# Patient Record
Sex: Male | Born: 1995 | Race: Black or African American | Hispanic: No | Marital: Single | Smoking: Former smoker
Health system: Southern US, Community
[De-identification: ages and names within clinical notes are randomized; demographics above are authoritative.]

## PROBLEM LIST (undated history)

## (undated) ENCOUNTER — Ambulatory Visit: Admission: EM | Payer: Medicaid Other

---

## 2019-05-29 ENCOUNTER — Other Ambulatory Visit: Payer: Self-pay

## 2019-05-29 ENCOUNTER — Encounter (HOSPITAL_COMMUNITY): Payer: Self-pay

## 2019-05-29 ENCOUNTER — Emergency Department (HOSPITAL_COMMUNITY)
Admission: EM | Admit: 2019-05-29 | Discharge: 2019-05-29 | Disposition: A | Discharge status: Home / Self Care | Payer: Worker's Compensation | Attending: Emergency Medicine | Admitting: Emergency Medicine

## 2019-05-29 ENCOUNTER — Emergency Department (HOSPITAL_COMMUNITY): Payer: Worker's Compensation

## 2019-05-29 DIAGNOSIS — Y9289 Other specified places as the place of occurrence of the external cause: Secondary | ICD-10-CM | POA: Insufficient documentation

## 2019-05-29 DIAGNOSIS — S8001XA Contusion of right knee, initial encounter: Secondary | ICD-10-CM | POA: Diagnosis not present

## 2019-05-29 DIAGNOSIS — Y990 Civilian activity done for income or pay: Secondary | ICD-10-CM | POA: Insufficient documentation

## 2019-05-29 DIAGNOSIS — Y9389 Activity, other specified: Secondary | ICD-10-CM | POA: Diagnosis not present

## 2019-05-29 DIAGNOSIS — S8991XA Unspecified injury of right lower leg, initial encounter: Secondary | ICD-10-CM | POA: Diagnosis present

## 2019-05-29 DIAGNOSIS — W230XXA Caught, crushed, jammed, or pinched between moving objects, initial encounter: Secondary | ICD-10-CM | POA: Insufficient documentation

## 2019-05-29 NOTE — ED Triage Notes (Signed)
Per ems: Pt coming from work when a loaded forklift pinned him against a conveyer belt. Pt is c/o right knee pain. Pt is ambulatory in triage.

## 2019-05-29 NOTE — ED Provider Notes (Signed)
[facility] DEPT Provider Note   CSN: [id number] Arrival date & time: 05/29/19  0144     History   Chief Complaint Chief Complaint  Patient presents with  . Knee Pain    HPI Jose Ochoa is a 23 y.o. male presents to the ER for evaluation of work-related injury that occurred last evening prior to arrival.  Patient was loading a conveyor belt with boxes when a forklift drove into him hitting his posterior right knee and bending his knee against a conveyor belt for approximately 20 to 30 seconds.  Reports left distal thigh and medial knee pain associated with swelling.  Pain is worse with direct palpation, minimal pain with weightbearing.  No interventions.  He has no pain at rest.  No previous history of injuries or surgeries to the knee.  No other physical injuries.  Denies distal tingling, loss of sensation, calf pain or swelling.  No anticoagulants.     HPI  History reviewed. No pertinent past medical history.  There are no active problems to display for this patient.   History reviewed. No pertinent surgical history.      Home Medications    Prior to Admission medications   Not on File    Family History No family history on file.  Social History Social History   Tobacco Use  . Smoking status: Not on file  Substance Use Topics  . Alcohol use: Not on file  . Drug use: Not on file     Allergies   Patient has no allergy information on record.   Review of Systems Review of Systems  Musculoskeletal: Positive for arthralgias and joint swelling.  All other systems reviewed and are negative.    Physical Exam Updated Vital Signs BP 110/65 (BP Location: Right Arm)   Pulse 60   Temp 98.2 F (36.8 C) (Oral)   Resp 16   SpO2 100% Comment: Simultaneous filing. User may not have seen previous data.  Physical Exam Constitutional:      Appearance: He is well-developed.  HENT:     Head: Normocephalic.     Nose: Nose normal.   Eyes:     General: Lids are normal.  Neck:     Musculoskeletal: Normal range of motion.  Cardiovascular:     Rate and Rhythm: Normal rate.     Comments: 1+ popliteal pulses bilaterally. Pulmonary:     Effort: Pulmonary effort is normal. No respiratory distress.  Musculoskeletal: Normal range of motion.        General: Swelling and tenderness present.     Comments: Mild, focal edema, contusion with overlying abrasion in the left distal femur/medial upper knee joint.  Mild medial joint line tenderness.  No focal bony tenderness to quad, patella tendons, patella, lateral joint line, popliteal fossa.  Patient stands and ambulates without any antalgic gait and only mild pain.  Full flexion and extension without pain, normal J tracking of the patella.  No joint laxity.  No proximal or distal calf tenderness or edema.  Neurological:     Mental Status: He is alert.     Comments: sensation strength intact in lower extremities bilaterally.  Psychiatric:        Behavior: Behavior normal.      ED Treatments / Results  Labs (all labs ordered are listed, but only abnormal results are displayed) Labs Reviewed - No data to display  EKG None  Radiology Dg Knee Complete 4 Views Right  Result Date: 05/29/2019 CLINICAL DATA:  Pain after trauma EXAM: RIGHT KNEE - COMPLETE 4+ VIEW COMPARISON:  None. FINDINGS: There is a defect in the upper outer quadrant of the patella is well corticated and is consistent with a bipartite patella. No other evidence of fracture. There may be a small joint effusion. IMPRESSION: Bipartite patella. Possible small effusion. No acute fracture noted. Electronically Signed   By: Gerome Sam III M.D   On: 05/29/2019 02:31    Procedures Procedures (including critical care time)  Medications Ordered in ED Medications - No data to display   Initial Impression / Assessment and Plan / ED Course  I have reviewed the triage vital signs and the nursing notes.  Pertinent  labs & imaging results that were available during my care of the patient were reviewed by me and considered in my medical decision making (see chart for details).  Clinical Course as of May 28 899  Sat May 29, 2019  5638 There is a defect in the upper outer quadrant of the patella is well corticated and is consistent with a bipartite patella. No other evidence of fracture. There may be a small joint effusion.  DG Knee Complete 4 Views Right [CG]    Clinical Course User Index [CG] Liberty Handy, PA-C   Exam is very benign, he has contusion in the left medial upper knee but overall reassuring.  No focal bony tenderness otherwise to knee.  Upper and lower compartments soft.  Neurovascularly intact.  X-ray obtained in triage shows bipartite patella with small joint effusion, otherwise nonacute.  Patient is ambulatory here with minimal pain.  Do not think further emergent lab work or imaging is indicated.  Discussed plan to discharge with knee sleeve, compression, elevation, ice, NSAIDs, early range of motion exercises.  Recommend orthopedist follow-up in 7 to 10 days if symptoms not improving.  Return precautions given.  Is comfortable with this.  Final Clinical Impressions(s) / ED Diagnoses   Final diagnoses:  Work related injury  Contusion of right knee, initial encounter    ED Discharge Orders    None       Liberty Handy, PA-C 05/29/19 0900    Alvira Monday, MD 05/31/19 1314

## 2019-05-29 NOTE — Discharge Instructions (Signed)
You were seen in the ER for work-related injury and knee pain.  X-ray showed a small amount of fluid in the joint that can happen from local trauma or falls.  X-ray did not show any acute bone related injuries.  It is possible you may have sustained a mild soft tissue injury.  We will treat this with a knee sleeve for compression, rest, ice, elevation and anti-inflammatories.  X-ray showed a bipartite knee, this is not an injury but can be seen as a normal variant in some people and usually benign and does not cause any issues.  Rest for the next 48 hours.  Elevate your knee.  Ice for 20 minutes at least 3 times a day.  Alternate ibuprofen and acetaminophen every 6-8 hours for pain.  Slowly return back to activities in the next 72 hours if symptoms are improving.

## 2019-11-18 ENCOUNTER — Emergency Department (HOSPITAL_COMMUNITY): Payer: Medicaid Other

## 2019-11-18 ENCOUNTER — Other Ambulatory Visit: Payer: Self-pay

## 2019-11-18 ENCOUNTER — Encounter (HOSPITAL_COMMUNITY): Payer: Self-pay

## 2019-11-18 DIAGNOSIS — Y9289 Other specified places as the place of occurrence of the external cause: Secondary | ICD-10-CM | POA: Insufficient documentation

## 2019-11-18 DIAGNOSIS — R2242 Localized swelling, mass and lump, left lower limb: Secondary | ICD-10-CM | POA: Insufficient documentation

## 2019-11-18 DIAGNOSIS — Y939 Activity, unspecified: Secondary | ICD-10-CM | POA: Insufficient documentation

## 2019-11-18 DIAGNOSIS — Z5321 Procedure and treatment not carried out due to patient leaving prior to being seen by health care provider: Secondary | ICD-10-CM | POA: Insufficient documentation

## 2019-11-18 DIAGNOSIS — Y990 Civilian activity done for income or pay: Secondary | ICD-10-CM | POA: Insufficient documentation

## 2019-11-18 DIAGNOSIS — M79662 Pain in left lower leg: Secondary | ICD-10-CM | POA: Insufficient documentation

## 2019-11-18 DIAGNOSIS — W230XXA Caught, crushed, jammed, or pinched between moving objects, initial encounter: Secondary | ICD-10-CM | POA: Insufficient documentation

## 2019-11-18 NOTE — ED Triage Notes (Signed)
Pt reports L lower leg pain and swelling. He states that he was working and his knee got pinned between a pallet and a pole at work. Swelling noted.

## 2019-11-19 ENCOUNTER — Encounter (HOSPITAL_COMMUNITY): Payer: Self-pay | Admitting: Emergency Medicine

## 2019-11-19 ENCOUNTER — Inpatient Hospital Stay: Payer: Medicaid Other | Admitting: Orthopedic Surgery

## 2019-11-19 ENCOUNTER — Other Ambulatory Visit: Payer: Self-pay

## 2019-11-19 ENCOUNTER — Emergency Department (HOSPITAL_COMMUNITY)
Admission: EM | Admit: 2019-11-19 | Discharge: 2019-11-19 | Disposition: A | Discharge status: Home / Self Care | Payer: Self-pay | Attending: Emergency Medicine | Admitting: Emergency Medicine

## 2019-11-19 ENCOUNTER — Emergency Department (HOSPITAL_BASED_OUTPATIENT_CLINIC_OR_DEPARTMENT_OTHER): Payer: Medicaid Other

## 2019-11-19 ENCOUNTER — Emergency Department (HOSPITAL_BASED_OUTPATIENT_CLINIC_OR_DEPARTMENT_OTHER)
Admission: EM | Admit: 2019-11-19 | Discharge: 2019-11-19 | Disposition: A | Discharge status: Home / Self Care | Payer: Self-pay | Attending: Emergency Medicine | Admitting: Emergency Medicine

## 2019-11-19 ENCOUNTER — Encounter (HOSPITAL_BASED_OUTPATIENT_CLINIC_OR_DEPARTMENT_OTHER): Payer: Self-pay

## 2019-11-19 DIAGNOSIS — S82402A Unspecified fracture of shaft of left fibula, initial encounter for closed fracture: Secondary | ICD-10-CM

## 2019-11-19 DIAGNOSIS — Y990 Civilian activity done for income or pay: Secondary | ICD-10-CM | POA: Insufficient documentation

## 2019-11-19 DIAGNOSIS — Y939 Activity, unspecified: Secondary | ICD-10-CM | POA: Insufficient documentation

## 2019-11-19 DIAGNOSIS — Y929 Unspecified place or not applicable: Secondary | ICD-10-CM | POA: Insufficient documentation

## 2019-11-19 DIAGNOSIS — M79662 Pain in left lower leg: Secondary | ICD-10-CM | POA: Insufficient documentation

## 2019-11-19 DIAGNOSIS — S42492A Other displaced fracture of lower end of left humerus, initial encounter for closed fracture: Secondary | ICD-10-CM | POA: Insufficient documentation

## 2019-11-19 DIAGNOSIS — W3189XA Contact with other specified machinery, initial encounter: Secondary | ICD-10-CM | POA: Insufficient documentation

## 2019-11-19 DIAGNOSIS — Z5321 Procedure and treatment not carried out due to patient leaving prior to being seen by health care provider: Secondary | ICD-10-CM | POA: Insufficient documentation

## 2019-11-19 DIAGNOSIS — Z87891 Personal history of nicotine dependence: Secondary | ICD-10-CM | POA: Insufficient documentation

## 2019-11-19 MED ORDER — IBUPROFEN 400 MG PO TABS
400.0000 mg | ORAL_TABLET | Freq: Once | ORAL | Status: AC | PRN
  Administered 2019-11-19: 400 mg via ORAL
  Filled 2019-11-19: qty 1

## 2019-11-19 MED ORDER — HYDROCODONE-ACETAMINOPHEN 5-325 MG PO TABS: 1.0000 | ORAL_TABLET | ORAL | 0 refills | Status: DC | PRN

## 2019-11-19 MED ORDER — HYDROCODONE-ACETAMINOPHEN 5-325 MG PO TABS
1.0000 | ORAL_TABLET | Freq: Once | ORAL | Status: AC
  Administered 2019-11-19: 1 via ORAL
  Filled 2019-11-19: qty 1

## 2019-11-19 NOTE — ED Provider Notes (Signed)
[facility] DEPART[facility] Provider Note   CSN: [id number] Arrival date & time: 11/19/19  0018   History Chief Complaint  Patient presents with  . Leg Injury    [facility] Ochoa is a 24 y.o. male.  The history is provided by the patient.  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  He is complaining of pain which he rates at 8/10.  He denies other injury.  History reviewed. No pertinent past medical history.  There are no problems to display for this patient.   History reviewed. No pertinent surgical history.     No family history on file.  Social History   Tobacco Use  . Smoking status: Former Research scientist (life sciences)  . Smokeless tobacco: Never Used  Substance Use Topics  . Alcohol use: Yes  . Drug use: Not Currently    Home Medications Prior to Admission medications   Not on File    Allergies    Patient has no known allergies.  Review of Systems   Review of Systems  All other systems reviewed and are negative.   Physical Exam Updated Vital Signs BP 132/83 (BP Location: Right Arm)   Pulse 74   Temp 98.8 F (37.1 C) (Oral)   Resp 20   Ht 5\' 8"  (1.727 m)   Wt 70.3 kg   SpO2 100%   BMI 23.57 kg/m   Physical Exam Vitals and nursing note reviewed.   24 year old male, resting comfortably and in no acute distress. Vital signs are normal. Oxygen saturation is 100%, which is normal. Head is normocephalic and atraumatic. PERRLA, EOMI. Oropharynx is clear. Neck is nontender and supple without adenopathy or JVD. Back is nontender and there is no CVA tenderness. Lungs are clear without rales, wheezes, or rhonchi. Chest is nontender. Heart has regular rate and rhythm without murmur. Abdomen is soft, flat, nontender without masses or hepatosplenomegaly and peristalsis is normoactive. Extremities: There is moderate swelling of the left ankle extending into the distal lower leg and proximal midfoot.  There is tenderness to  palpation in the same area.  There is mild instability of the ankle mortise.  Dorsalis pedis pulse is palpable and there is normal sensation.  Capillary refill is prompt.  No other extremity injury seen. Skin is warm and dry without rash. Neurologic: Mental status is normal, cranial nerves are intact, there are no motor or sensory deficits.  ED Results / Procedures / Treatments    Radiology DG Tibia/Fibula Left  Result Date: 11/18/2019 CLINICAL DATA:  Left lower leg pain and swelling. Leg got pinned between a pallet and pole at work. Pain distally. EXAM: LEFT TIBIA AND FIBULA - 2 VIEW COMPARISON:  None. FINDINGS: Transverse fracture of the distal fibula just above the ankle mortise. Cortical irregularity of the distal tibia just above the medial malleolus suspicious for acute fracture. No definite posterior tibial tubercle fracture. Proximal tibia and fibula are intact. Generalized soft tissue edema which is most prominent distally. There is an ankle joint effusion. IMPRESSION: 1. Nondisplaced fracture of the distal fibula just above the ankle mortise. 2. Cortical irregularity of the distal tibia just above the medial malleolus suspicious for nondisplaced fracture. 3. Soft tissue edema about the distal lower leg at the fracture sites. Electronically Signed   By: Keith Rake M.D.   On: 11/18/2019 21:31    Procedures .Splint Application  Date/Time: 11/19/2019 4:46 AM Performed by: Delora Fuel, MD Authorized by: Delora Fuel,  MD   Consent:    Consent obtained:  Verbal   Consent given by:  Patient   Risks discussed:  Pain, numbness and swelling   Alternatives discussed:  No treatment Pre-procedure details:    Sensation:  Normal   Skin color:  Normal Procedure details:    Laterality:  Left   Location:  Ankle   Ankle:  L ankle   Splint type: CAM Walker.   Supplies used: CAM Walker. Post-procedure details:    Pain:  Unchanged   Sensation:  Normal   Skin color:  Normal   Patient  tolerance of procedure:  Tolerated well, no immediate complications    Medications Ordered in ED Medications  ibuprofen (ADVIL) tablet 400 mg (400 mg Oral Given 11/19/19 0205)  HYDROcodone-acetaminophen (NORCO/VICODIN) 5-325 MG per tablet 1 tablet (1 tablet Oral Given 11/19/19 0302)    ED Course  I have reviewed the triage vital signs and the nursing notes.  Pertinent labs & imaging results that were available during my care of the patient were reviewed by me and considered in my medical decision making (see chart for details).  MDM Rules/Calculators/A&P Left ankle injury.  He is being sent for x-rays.  He is given a dose of hydrocodone-acetaminophen for pain.  Old records are reviewed, and he does have a prior ED visit for a work-related injury.  X-rays confirm nondisplaced fracture of the medial and lateral malleolus.  He is placed in a cam walker and given crutches and is referred to orthopedics for follow-up.  Told to use ibuprofen for pain control but given a prescription for a small number of hydrocodone-acetaminophen tablets.  Final Clinical Impression(s) / ED Diagnoses Final diagnoses:  Traumatic closed displaced fracture of distal end of left tibia with fibula, initial encounter    Rx / DC Orders ED Discharge Orders         Ordered    HYDROcodone-acetaminophen (NORCO) 5-325 MG tablet  Every 4 hours PRN     11/19/19 0432           Dione Booze, MD 11/19/19 0448

## 2019-11-19 NOTE — ED Triage Notes (Signed)
Pt states his LLE was pinned between a pallet jack and the wall. Pt has obvious swelling to the lower extremity.

## 2019-11-19 NOTE — Discharge Instructions (Addendum)
Wear the CAM Walker at all times, except when bathing.  No weight bearing until cleared by the orthopedic doctor.  Apply ice for thirty minutes at a time, four times a day.  Take ibuprofen as needed for pain. Reserve hydrocodone-acetaminophen for pain not relieved by ibuprofen.

## 2019-11-19 NOTE — ED Triage Notes (Signed)
Pt from home arrived via EMS for left leg pain post tib/fib fx x1 day ago. Seen at [facility] last evening given vicodin for pain that is ineffective

## 2019-11-19 NOTE — ED Notes (Signed)
Pt at xray

## 2019-11-19 NOTE — ED Notes (Signed)
Pt refused to sign discharge until he spoke with registration about payment

## 2019-11-20 ENCOUNTER — Emergency Department (HOSPITAL_COMMUNITY)
Admission: EM | Admit: 2019-11-20 | Discharge: 2019-11-20 | Disposition: A | Discharge status: Home / Self Care | Payer: Medicaid Other | Source: Home / Self Care

## 2019-11-23 ENCOUNTER — Encounter: Payer: Self-pay | Admitting: Orthopaedic Surgery

## 2019-11-23 ENCOUNTER — Ambulatory Visit (INDEPENDENT_AMBULATORY_CARE_PROVIDER_SITE_OTHER): Payer: No Typology Code available for payment source | Admitting: Orthopaedic Surgery

## 2019-11-23 ENCOUNTER — Other Ambulatory Visit: Payer: Self-pay

## 2019-11-23 DIAGNOSIS — S82845A Nondisplaced bimalleolar fracture of left lower leg, initial encounter for closed fracture: Secondary | ICD-10-CM

## 2019-11-23 NOTE — Progress Notes (Signed)
Office Visit Note   Patient: [facility] O[facility]a           Date of Birth: 1995-10-05           MRN: [id number] Visit Date: 11/23/2019              Requested by: No referring provider defined for this encounter. PCP: Patient, No Pcp Per   Assessment & Plan: Visit Diagnoses:  1. Closed nondisplaced bimalleolar fracture of left ankle, initial encounter     Plan: Impression is nondisplaced bimalleolar ankle fractures.  We will monitor this fracture closely as we will try to treat this nonoperatively.  He needs to be nonweightbearing.  He needs to be out of work for at least 6weeks.  He will ambulate with crutches while he is nonweightbearing.  Recheck next week with three-view x-rays of the left ankle.  All of this was discussed with his mother in detail.  Follow-Up Instructions: Return in about 1 week (around 11/30/2019).   Orders:  No orders of the defined types were placed in this encounter.  No orders of the defined types were placed in this encounter.     Procedures: No procedures performed   Clinical Data: No additional findings.   Subjective: Chief Complaint  Patient presents with  . Left Ankle - Pain    [facility] Ochoa is a 24 year old comes in for evaluation of a work injury that he sustained on 11/18/2019.  His leg was pinned between some pallets.  He works for [employer]  He was evaluated in the ER and x-rays showed nondisplaced bimalleolar ankle fracture.  He was placed in a splint and given follow-up.  He endorses a lot of throbbing pulsating pain in his left leg.  Endorses some numbness and tingling as well.   Review of Systems  Constitutional: Negative.   All other systems reviewed and are negative.    Objective: Vital Signs: There were no vitals taken for this visit.  Physical Exam Vitals and nursing note reviewed.  Constitutional:      Appearance: He is well-developed.  HENT:     Head: Normocephalic and atraumatic.  Eyes:     Pupils:  Pupils are equal, round, and reactive to light.  Pulmonary:     Effort: Pulmonary effort is normal.  Abdominal:     Palpations: Abdomen is soft.  Musculoskeletal:        General: Normal range of motion.     Cervical back: Neck supple.  Skin:    General: Skin is warm.  Neurological:     Mental Status: He is alert and oriented to person, place, and time.  Psychiatric:        Behavior: Behavior normal.        Thought Content: Thought content normal.        Judgment: Judgment normal.     Ortho Exam Left leg and ankle show moderate swelling without any neurovascular compromise. Specialty Comments:  No specialty comments available.  Imaging: No results found.   PMFS History: Patient Active Problem List   Diagnosis Date Noted  . Closed nondisplaced bimalleolar fracture of left lower leg 11/23/2019   History reviewed. No pertinent past medical history.  History reviewed. No pertinent family history.  History reviewed. No pertinent surgical history. Social History   Occupational History  . Not on file  Tobacco Use  . Smoking status: Former Games developer  . Smokeless tobacco: Never Used  Substance and Sexual Activity  . Alcohol use: Yes  .  Drug use: Not Currently  . Sexual activity: Not on file

## 2019-11-25 ENCOUNTER — Telehealth: Payer: Self-pay | Admitting: Orthopaedic Surgery

## 2019-11-25 ENCOUNTER — Ambulatory Visit (INDEPENDENT_AMBULATORY_CARE_PROVIDER_SITE_OTHER): Payer: Worker's Compensation

## 2019-11-25 DIAGNOSIS — S82845A Nondisplaced bimalleolar fracture of left lower leg, initial encounter for closed fracture: Secondary | ICD-10-CM

## 2019-11-25 NOTE — Progress Notes (Signed)
Patient comes in today for nurse visit. He got the splint wet. I removed and reapplied a new splint today.

## 2019-11-25 NOTE — Telephone Encounter (Signed)
Raymon Mutton from [facility] called.   They need the office notes from the patient's last appointment faxed over   Fax number: [phone] Call back: [phone]

## 2019-11-25 NOTE — Telephone Encounter (Signed)
FAXED

## 2019-11-28 ENCOUNTER — Encounter (HOSPITAL_COMMUNITY): Payer: Self-pay | Admitting: Emergency Medicine

## 2019-11-28 ENCOUNTER — Other Ambulatory Visit: Payer: Self-pay

## 2019-11-28 ENCOUNTER — Emergency Department (HOSPITAL_COMMUNITY)
Admission: EM | Admit: 2019-11-28 | Discharge: 2019-11-29 | Disposition: A | Discharge status: Home / Self Care | Payer: Self-pay | Attending: Emergency Medicine | Admitting: Emergency Medicine

## 2019-11-28 DIAGNOSIS — S82842D Displaced bimalleolar fracture of left lower leg, subsequent encounter for closed fracture with routine healing: Secondary | ICD-10-CM | POA: Insufficient documentation

## 2019-11-28 DIAGNOSIS — Y990 Civilian activity done for income or pay: Secondary | ICD-10-CM | POA: Insufficient documentation

## 2019-11-28 DIAGNOSIS — Y9289 Other specified places as the place of occurrence of the external cause: Secondary | ICD-10-CM | POA: Insufficient documentation

## 2019-11-28 DIAGNOSIS — X58XXXD Exposure to other specified factors, subsequent encounter: Secondary | ICD-10-CM | POA: Insufficient documentation

## 2019-11-28 DIAGNOSIS — Y939 Activity, unspecified: Secondary | ICD-10-CM | POA: Insufficient documentation

## 2019-11-28 DIAGNOSIS — Z87891 Personal history of nicotine dependence: Secondary | ICD-10-CM | POA: Insufficient documentation

## 2019-11-28 NOTE — ED Triage Notes (Addendum)
Pt BIB EMS from home c/o left leg pain. Pt has hx of broken left tib/fib that occurred on 5/28. Pt reporting his current pain meds aren't working for him. No other complaints.

## 2019-11-29 NOTE — ED Provider Notes (Signed)
[facility] DEPT Provider Note  CSN: [id number] Arrival date & time: 11/28/19 2244  Chief Complaint(s) Leg Pain  HPI [facility] Ochoa is a 24 y.o. male with recent left bimalleolar fracture s/p splinting and ortho follow up here for recurring severe pain of the left ankle. No additional trauma. States that Norco does not help, but motrin and tylenol provide some relief. Awoke from a nap today with severe pain and wanted a second opinion regarding surgery.  HPI  Past Medical History History reviewed. No pertinent past medical history. Patient Active Problem List   Diagnosis Date Noted   Closed nondisplaced bimalleolar fracture of left lower leg 11/23/2019   Home Medication(s) Prior to Admission medications   Medication Sig Start Date End Date Taking? Authorizing Provider  HYDROcodone-acetaminophen (NORCO) 5-325 MG tablet Take 1 tablet by mouth every 4 (four) hours as needed for moderate pain. 10/08/58   Delora Fuel, MD                                                                                                                                    Past Surgical History History reviewed. No pertinent surgical history. Family History No family history on file.  Social History Social History   Tobacco Use   Smoking status: Former Smoker   Smokeless tobacco: Never Used  Substance Use Topics   Alcohol use: Yes   Drug use: Not Currently   Allergies Patient has no known allergies.  Review of Systems Review of Systems All other systems are reviewed and are negative for acute change except as noted in the HPI Physical Exam Vital Signs  I have reviewed the triage vital signs BP 138/67 (BP Location: Right Arm)    Pulse (!) 106    Temp 98.6 F (37 C) (Oral)    Resp 16    SpO2 99%   Physical Exam Vitals reviewed.  Constitutional:      General: He is not in acute distress.    Appearance: He is well-developed. He is not diaphoretic.  HENT:   Head: Normocephalic and atraumatic.     Jaw: No trismus.     Right Ear: External ear normal.     Left Ear: External ear normal.     Nose: Nose normal.  Eyes:     General: No scleral icterus.    Conjunctiva/sclera: Conjunctivae normal.  Neck:     Trachea: Phonation normal.  Cardiovascular:     Rate and Rhythm: Normal rate and regular rhythm.  Pulmonary:     Effort: Pulmonary effort is normal. No respiratory distress.     Breath sounds: No stridor.  Abdominal:     General: There is no distension.  Musculoskeletal:     Cervical back: Normal range of motion.     Left ankle: Swelling present. Tenderness present over the lateral malleolus and medial malleolus. Decreased range of motion (to ankle, still able to move toes).  Normal pulse.       Legs:     Comments: Sensation intact. Compartments soft. No calf tenderness   Neurological:     Mental Status: He is alert and oriented to person, place, and time.  Psychiatric:        Behavior: Behavior normal.     ED Results and Treatments Labs (all labs ordered are listed, but only abnormal results are displayed) Labs Reviewed - No data to display                                                                                                                       EKG  EKG Interpretation  Date/Time:    Ventricular Rate:    PR Interval:    QRS Duration:   QT Interval:    QTC Calculation:   R Axis:     Text Interpretation:        Radiology No results found.  Pertinent labs & imaging results that were available during my care of the patient were reviewed by me and considered in my medical decision making (see chart for details).  Medications Ordered in ED Medications - No data to display                                                                                                                                  Procedures .Splint Application  Date/Time: 11/29/2019 1:44 AM Performed by: Nira Conn, MD Authorized  by: Nira Conn, MD   Consent:    Consent obtained:  Verbal   Consent given by:  Patient Pre-procedure details:    Sensation:  Normal Procedure details:    Laterality:  Left   Location:  Ankle   Ankle:  L ankle   Splint type:  Short leg   Supplies:  Cotton padding Post-procedure details:    Patient tolerance of procedure:  Tolerated well, no immediate complications    (including critical care time)  Medical Decision Making / ED Course I have reviewed the nursing notes for this encounter and the patient's prior records (if available in EHR or on provided paperwork).   [name] was evaluated in Emergency Department on 11/29/2019 for the symptoms described in the history of present illness. He was evaluated in the context of the global COVID-19 pandemic, which necessitated consideration that the patient might be at risk for infection with the SARS-CoV-2 virus that causes COVID-19. Institutional protocols  and algorithms that pertain to the evaluation of patients at risk for COVID-19 are in a state of rapid change based on information released by regulatory bodies including the CDC and federal and state organizations. These policies and algorithms were followed during the patient's care in the ED.  Pain related to recent fracture. No evidence compartment syndrome. No additional trauma - no imaging needed. Doubt DVT.  Has f/u with Ortho again this Friday.      Final Clinical Impression(s) / ED Diagnoses Final diagnoses:  Closed bimalleolar fracture of left ankle with routine healing, subsequent encounter   The patient appears reasonably screened and/or stabilized for discharge and I doubt any other medical condition or other [facility] requiring further screening, evaluation, or treatment in the ED at this time prior to discharge. Safe for discharge with strict return precautions.  Disposition: Discharge  Condition: Good  I have discussed the results, Dx and Tx plan with  the patient/family who expressed understanding and agree(s) with the plan. Discharge instructions discussed at length. The patient/family was given strict return precautions who verbalized understanding of the instructions. No further questions at time of discharge.    ED Discharge Orders    None        This chart was dictated using voice recognition software.  Despite best efforts to proofread,  errors can occur which can change the documentation meaning.   Nira Conn, MD 11/29/19 [id number]

## 2019-11-30 ENCOUNTER — Encounter: Payer: Self-pay | Admitting: Orthopaedic Surgery

## 2019-11-30 ENCOUNTER — Ambulatory Visit (INDEPENDENT_AMBULATORY_CARE_PROVIDER_SITE_OTHER): Payer: Self-pay

## 2019-11-30 ENCOUNTER — Other Ambulatory Visit: Payer: Self-pay

## 2019-11-30 ENCOUNTER — Ambulatory Visit (INDEPENDENT_AMBULATORY_CARE_PROVIDER_SITE_OTHER): Payer: No Typology Code available for payment source | Admitting: Orthopaedic Surgery

## 2019-11-30 DIAGNOSIS — S82845A Nondisplaced bimalleolar fracture of left lower leg, initial encounter for closed fracture: Secondary | ICD-10-CM

## 2019-11-30 MED ORDER — GABAPENTIN 100 MG PO CAPS: 100.0000 mg | ORAL_CAPSULE | Freq: Three times a day (TID) | ORAL | 3 refills | Status: AC

## 2019-11-30 MED ORDER — ZOLPIDEM TARTRATE 5 MG PO TABS: 5.0000 mg | ORAL_TABLET | Freq: Every evening | ORAL | 0 refills | Status: DC | PRN

## 2019-11-30 MED ORDER — KETOROLAC TROMETHAMINE 10 MG PO TABS: 10.0000 mg | ORAL_TABLET | Freq: Two times a day (BID) | ORAL | 0 refills | Status: AC | PRN

## 2019-11-30 NOTE — Progress Notes (Signed)
Office Visit Note   Patient: Jose Ochoa           Date of Birth: 02-03-1996           MRN: [id number] Visit Date: 11/30/2019              Requested by: No referring provider defined for this encounter. PCP: Patient, No Pcp Per   Assessment & Plan: Visit Diagnoses:  1. Closed nondisplaced bimalleolar fracture of left ankle, initial encounter     Plan: Impression is stable bimalleolar ankle fracture.  We had a lengthy discussion today and I showed him the x-rays show that these fractures are nondisplaced and that surgery would not help with his swelling and pain and in fact would likely make his symptoms worse.  I have sent in a prescription for gabapentin and Ambien and Toradol to help with his symptoms.  He needs to remain out of work.  Recheck in 2 weeks with three-view x-rays of the left ankle.  We provide him with a cam boot so that he can place ice packs on his ankle.  He needs to remain strictly nonweightbearing.  Follow-Up Instructions: Return in about 2 weeks (around 12/14/2019).   Orders:  Orders Placed This Encounter  Procedures   XR Ankle Complete Left   Meds ordered this encounter  Medications   gabapentin (NEURONTIN) 100 MG capsule    Sig: Take 1 capsule (100 mg total) by mouth 3 (three) times daily.    Dispense:  90 capsule    Refill:  3   zolpidem (AMBIEN) 5 MG tablet    Sig: Take 1-2 tablets (5-10 mg total) by mouth at bedtime as needed for sleep.    Dispense:  30 tablet    Refill:  0   ketorolac (TORADOL) 10 MG tablet    Sig: Take 1 tablet (10 mg total) by mouth 2 (two) times daily as needed.    Dispense:  10 tablet    Refill:  0      Procedures: No procedures performed   Clinical Data: No additional findings.   Subjective: Chief Complaint  Patient presents with   Left Ankle - Pain, Follow-up    Jose Ochoa returns today for evaluation of continued pain.  He states that he is not finding any relief with Tylenol Advil and hydrocodone.  He will  also like to have something to help him sleep.  He felt like the splint was too tight.  He is very concerned that is not healing correctly or needs surgery.   Review of Systems   Objective: Vital Signs: There were no vitals taken for this visit.  Physical Exam  Ortho Exam Exam shows moderately severe swelling.  No neurovascular compromise.  Limited range of motion. Specialty Comments:  No specialty comments available.  Imaging: XR Ankle Complete Left  Result Date: 11/30/2019 Stable nondisplaced bimalleolar ankle fracture without any interval worsening or displacement    PMFS History: Patient Active Problem List   Diagnosis Date Noted   Closed nondisplaced bimalleolar fracture of left lower leg 11/23/2019   History reviewed. No pertinent past medical history.  History reviewed. No pertinent family history.  History reviewed. No pertinent surgical history. Social History   Occupational History   Not on file  Tobacco Use   Smoking status: Former Smoker   Smokeless tobacco: Never Used  Substance and Sexual Activity   Alcohol use: Yes   Drug use: Not Currently   Sexual activity: Not on file

## 2019-12-03 ENCOUNTER — Ambulatory Visit: Payer: Medicaid Other | Admitting: Orthopaedic Surgery

## 2019-12-14 ENCOUNTER — Ambulatory Visit (INDEPENDENT_AMBULATORY_CARE_PROVIDER_SITE_OTHER): Payer: Self-pay

## 2019-12-14 ENCOUNTER — Ambulatory Visit (INDEPENDENT_AMBULATORY_CARE_PROVIDER_SITE_OTHER): Payer: Self-pay | Admitting: Orthopaedic Surgery

## 2019-12-14 DIAGNOSIS — S82845A Nondisplaced bimalleolar fracture of left lower leg, initial encounter for closed fracture: Secondary | ICD-10-CM

## 2019-12-14 MED ORDER — ZOLPIDEM TARTRATE 5 MG PO TABS: 5.0000 mg | ORAL_TABLET | Freq: Every evening | ORAL | 0 refills | Status: AC | PRN

## 2019-12-14 MED ORDER — TRAZODONE HCL 50 MG PO TABS: 50.0000 mg | ORAL_TABLET | Freq: Every evening | ORAL | 3 refills | Status: AC | PRN

## 2019-12-14 MED ORDER — MELATONIN 3 MG PO TABS: 3.0000 mg | ORAL_TABLET | Freq: Every day | ORAL | 3 refills | Status: AC

## 2019-12-14 NOTE — Progress Notes (Signed)
Office Visit Note   Patient: Jose Ochoa           Date of Birth: Nov 13, 1995           MRN: [id number] Visit Date: 12/14/2019              Requested by: No referring provider defined for this encounter. PCP: Patient, No Pcp Per   Assessment & Plan: Visit Diagnoses:  1. Closed nondisplaced bimalleolar fracture of left ankle, initial encounter     Plan: We placed him in a short leg cast.  He is to take baby aspirin twice a day for DVT prophylaxis.  I refilled his Ambien but also given prescription for trazodone and melatonin to try first to help with sleeping.  Recheck in 3 weeks with three-view x-rays of the left ankle.  Follow-Up Instructions: Return in about 3 weeks (around 01/04/2020).   Orders:  Orders Placed This Encounter  Procedures  . XR Ankle Complete Left   Meds ordered this encounter  Medications  . traZODone (DESYREL) 50 MG tablet    Sig: Take 1 tablet (50 mg total) by mouth at bedtime as needed for sleep.    Dispense:  20 tablet    Refill:  3  . melatonin 3 MG TABS tablet    Sig: Take 1 tablet (3 mg total) by mouth at bedtime.    Dispense:  20 tablet    Refill:  3  . zolpidem (AMBIEN) 5 MG tablet    Sig: Take 1 tablet (5 mg total) by mouth at bedtime as needed for sleep.    Dispense:  20 tablet    Refill:  0      Procedures: No procedures performed   Clinical Data: No additional findings.   Subjective: Chief Complaint  Patient presents with  . Left Ankle - Fracture, Follow-up    Buren returns today for his bimalleolar ankle fracture.  He is doing better.   Review of Systems  Constitutional: Negative.   All other systems reviewed and are negative.    Objective: Vital Signs: There were no vitals taken for this visit.  Physical Exam Vitals and nursing note reviewed.  Constitutional:      Appearance: He is well-developed.  Pulmonary:     Effort: Pulmonary effort is normal.  Abdominal:     Palpations: Abdomen is soft.  Skin:     General: Skin is warm.  Neurological:     Mental Status: He is alert and oriented to person, place, and time.  Psychiatric:        Behavior: Behavior normal.        Thought Content: Thought content normal.        Judgment: Judgment normal.     Ortho Exam He has less swelling and less pain overall. Specialty Comments:  No specialty comments available.  Imaging: XR Ankle Complete Left  Result Date: 12/14/2019 Stable appearance of bimalleolar ankle fracture without complications or worsening.    PMFS History: Patient Active Problem List   Diagnosis Date Noted  . Closed nondisplaced bimalleolar fracture of left lower leg 11/23/2019   No past medical history on file.  No family history on file.  No past surgical history on file. Social History   Occupational History  . Not on file  Tobacco Use  . Smoking status: Former Games developer  . Smokeless tobacco: Never Used  Vaping Use  . Vaping Use: Never used  Substance and Sexual Activity  . Alcohol use: Yes  .  Drug use: Not Currently  . Sexual activity: Not on file

## 2020-01-04 ENCOUNTER — Ambulatory Visit: Payer: Medicaid Other | Admitting: Orthopaedic Surgery

## 2020-01-04 ENCOUNTER — Telehealth: Payer: Self-pay | Admitting: Orthopaedic Surgery

## 2020-01-04 ENCOUNTER — Ambulatory Visit (INDEPENDENT_AMBULATORY_CARE_PROVIDER_SITE_OTHER): Payer: Self-pay | Admitting: Orthopaedic Surgery

## 2020-01-04 ENCOUNTER — Ambulatory Visit (INDEPENDENT_AMBULATORY_CARE_PROVIDER_SITE_OTHER): Payer: Self-pay

## 2020-01-04 ENCOUNTER — Encounter: Payer: Self-pay | Admitting: Orthopaedic Surgery

## 2020-01-04 VITALS — Ht 68.0 in | Wt 155.0 lb

## 2020-01-04 DIAGNOSIS — S82845A Nondisplaced bimalleolar fracture of left lower leg, initial encounter for closed fracture: Secondary | ICD-10-CM

## 2020-01-04 MED ORDER — HYDROCODONE-ACETAMINOPHEN 5-325 MG PO TABS: 1.0000 | ORAL_TABLET | Freq: Every day | ORAL | 0 refills | Status: AC | PRN

## 2020-01-04 NOTE — Progress Notes (Signed)
   Office Visit Note   Patient: Jose Ochoa           Date of Birth: 23-Feb-1996           MRN: [id number] Visit Date: 01/04/2020              Requested by: No referring provider defined for this encounter. PCP: Patient, No Pcp Per   Assessment & Plan: Visit Diagnoses:  1. Closed nondisplaced bimalleolar fracture of left ankle, initial encounter     Plan: Impression is 7 weeks status post left ankle nondisplaced bimalleolar ankle fracture.  This fracture is healing nicely at this point and we will transition him into a cam walker weightbearing as tolerated.  We will also start him in physical therapy.  Internal prescription was sent in.  I have refilled his Norco today upon his request.  He will follow-up with [place] in 3 weeks time for repeat evaluation three-view x-rays of the left ankle.  Follow-Up Instructions: Return in about 3 weeks (around 01/25/2020).   Orders:  Orders Placed This Encounter  Procedures  . XR Ankle Complete Left  . Ambulatory referral to Physical Therapy   Meds ordered this encounter  Medications  . HYDROcodone-acetaminophen (NORCO) 5-325 MG tablet    Sig: Take 1-2 tablets by mouth daily as needed.    Dispense:  10 tablet    Refill:  0      Procedures: No procedures performed   Clinical Data: No additional findings.   Subjective: Chief Complaint  Patient presents with  . Left Ankle - Pain    HPI patient is a pleasant 24 year old who comes in today 7 weeks out nondisplaced bimalleolar ankle fracture on the left, date of injury 11/18/2019.  He has been compliant nonweightbearing in a short leg cast.  He has had mild to moderate pain which is relieved with Norco.  Review of Systems as detailed in HPI.  All others reviewed and are negative.   Objective: Vital Signs: Ht 5\' 8"  (1.727 m)   Wt 155 lb (70.3 kg)   BMI 23.57 kg/m   Physical Exam well-developed well-nourished gentleman in no acute distress.  Alert and oriented x3.  Ortho Exam  examination of his left ankle reveals minimal swelling.  Moderate tenderness to the lateral aspect.  No tenderness medially.  Limited range of motion secondary to stiffness.  Calf is soft nontender.  He is neurovascular intact distally.  Specialty Comments:  No specialty comments available.  Imaging: XR Ankle Complete Left  Result Date: 01/04/2020 X-rays demonstrate stable alignment of the fractures with considerable bony consolidation    PMFS History: Patient Active Problem List   Diagnosis Date Noted  . Closed nondisplaced bimalleolar fracture of left lower leg 11/23/2019   History reviewed. No pertinent past medical history.  History reviewed. No pertinent family history.  History reviewed. No pertinent surgical history. Social History   Occupational History  . Not on file  Tobacco Use  . Smoking status: Former 01/23/2020  . Smokeless tobacco: Never Used  Vaping Use  . Vaping Use: Never used  Substance and Sexual Activity  . Alcohol use: Yes  . Drug use: Not Currently  . Sexual activity: Not on file

## 2020-01-04 NOTE — Telephone Encounter (Signed)
Patient stopped by before leaving.   He requested that today's appointment notes be faxed to [phone]

## 2020-01-05 NOTE — Telephone Encounter (Signed)
FAXED

## 2020-01-19 ENCOUNTER — Ambulatory Visit: Payer: Medicaid Other | Admitting: Physical Therapy

## 2020-01-19 ENCOUNTER — Telehealth: Payer: Self-pay

## 2020-01-19 ENCOUNTER — Other Ambulatory Visit: Payer: Self-pay

## 2020-01-19 NOTE — Telephone Encounter (Signed)
Patient called in needing documentation for PT . Says when he went for his appt they advised him he needed documentation. But wasn't sure for what.

## 2020-01-25 ENCOUNTER — Ambulatory Visit (INDEPENDENT_AMBULATORY_CARE_PROVIDER_SITE_OTHER): Payer: Self-pay | Admitting: Orthopaedic Surgery

## 2020-01-25 ENCOUNTER — Telehealth: Payer: Self-pay | Admitting: Orthopaedic Surgery

## 2020-01-25 ENCOUNTER — Ambulatory Visit (INDEPENDENT_AMBULATORY_CARE_PROVIDER_SITE_OTHER): Payer: Self-pay

## 2020-01-25 ENCOUNTER — Encounter: Payer: Self-pay | Admitting: Orthopaedic Surgery

## 2020-01-25 DIAGNOSIS — S82845A Nondisplaced bimalleolar fracture of left lower leg, initial encounter for closed fracture: Secondary | ICD-10-CM

## 2020-01-25 NOTE — Progress Notes (Signed)
°  °  Patient: Jose Ochoa           Date of Birth: August 27, 1995           MRN: [id number] Visit Date: 01/25/2020 PCP: Patient, No Pcp Per   Assessment & Plan:  Chief Complaint:  Chief Complaint  Patient presents with   Left Ankle - Follow-up   Visit Diagnoses:  1. Closed nondisplaced bimalleolar fracture of left ankle, initial encounter     Plan: Jose Ochoa is following up today for his nondisplaced bimalleolar ankle fracture.  He feels about 80% better.  He has been walking with and without the boot without significant discomfort.  His swelling has improved significantly.  He is improved range of motion.  No bony tenderness.  X-rays are also demonstrating continued fracture consolidation.  At this point we will transition to an ASO brace.  He will continue with home exercise.  Recheck in 6 weeks with three-view x-rays of the left ankle.  Follow-Up Instructions: Return in about 6 weeks (around 03/07/2020).   Orders:  Orders Placed This Encounter  Procedures   XR Ankle Complete Left   No orders of the defined types were placed in this encounter.   Imaging: XR Ankle Complete Left  Result Date: 01/25/2020 Continued fracture consolidation.   PMFS History: Patient Active Problem List   Diagnosis Date Noted   Closed nondisplaced bimalleolar fracture of left lower leg 11/23/2019   History reviewed. No pertinent past medical history.  History reviewed. No pertinent family history.  History reviewed. No pertinent surgical history. Social History   Occupational History   Not on file  Tobacco Use   Smoking status: Former Smoker   Smokeless tobacco: Never Used  Building services engineer Use: Never used  Substance and Sexual Activity   Alcohol use: Yes   Drug use: Not Currently   Sexual activity: Not on file

## 2020-01-25 NOTE — Telephone Encounter (Signed)
Patient stopped by before leaving  Wanted to know if he had any work restrictions   Call back: [phone]

## 2020-01-25 NOTE — Telephone Encounter (Signed)
Patient comes in today

## 2020-01-26 NOTE — Telephone Encounter (Signed)
No prolonged standing or walking.  No climbing.  No lifting more than 20 lbs.

## 2020-01-26 NOTE — Telephone Encounter (Signed)
NOTE MADE 

## 2020-01-26 NOTE — Telephone Encounter (Signed)
Called patient no answer LMOM to return our call. Just need to advise on message below.   Note ready for pick up.

## 2020-03-07 ENCOUNTER — Ambulatory Visit (INDEPENDENT_AMBULATORY_CARE_PROVIDER_SITE_OTHER): Payer: Self-pay

## 2020-03-07 ENCOUNTER — Encounter: Payer: Self-pay | Admitting: Orthopaedic Surgery

## 2020-03-07 ENCOUNTER — Ambulatory Visit (INDEPENDENT_AMBULATORY_CARE_PROVIDER_SITE_OTHER): Payer: Self-pay | Admitting: Orthopaedic Surgery

## 2020-03-07 DIAGNOSIS — S82845A Nondisplaced bimalleolar fracture of left lower leg, initial encounter for closed fracture: Secondary | ICD-10-CM

## 2020-03-07 NOTE — Progress Notes (Signed)
Office Visit Note   Patient: Jose Ochoa           Date of Birth: 10-May-1996           MRN: [id number] Visit Date: 03/07/2020              Requested by: No referring provider defined for this encounter. PCP: Patient, No Pcp Per   Assessment & Plan: Visit Diagnoses:  1. Closed nondisplaced bimalleolar fracture of left ankle, initial encounter     Plan: Impression is left bimalleolar ankle fracture.  The medial malleolus appears to be healing well but the lateral malleolus shows a persistent fracture lucency with sclerotic edges.  Fortunately he is doing well clinically so I think he is probably healing this fracture but just slowly.  I would like to get him a bone stimulator at this point to help promote healing.  I would like to recheck him in 2 months with three-view x-rays of the left ankle.  Follow-Up Instructions: Return in about 2 months (around 05/07/2020).   Orders:  Orders Placed This Encounter  Procedures  . XR Ankle Complete Left   No orders of the defined types were placed in this encounter.     Procedures: No procedures performed   Clinical Data: No additional findings.   Subjective: Chief Complaint  Patient presents with  . Left Ankle - Pain, Follow-up    Jose Ochoa is 24 year old who is slightly more than 59-month status post a nondisplaced bimalleolar ankle fracture that he sustained at work.  He is following up today.  He has been wearing the ASO brace most the time.  He feels some stiffness and and tingling and that he is trying to skateboard but he notices difficulty doing this.  Overall he is doing well.  He is going to the gym.   Review of Systems  Constitutional: Negative.   All other systems reviewed and are negative.    Objective: Vital Signs: There were no vitals taken for this visit.  Physical Exam Vitals and nursing note reviewed.  Constitutional:      Appearance: He is well-developed.  Pulmonary:     Effort: Pulmonary effort is  normal.  Abdominal:     Palpations: Abdomen is soft.  Skin:    General: Skin is warm.  Neurological:     Mental Status: He is alert and oriented to person, place, and time.  Psychiatric:        Behavior: Behavior normal.        Thought Content: Thought content normal.        Judgment: Judgment normal.     Ortho Exam Left ankle shows minimal swelling.  Mild tenderness over the lateral malleolus.  Ankle range of motion and swelling have improved significantly. Specialty Comments:  No specialty comments available.  Imaging: XR Ankle Complete Left  Result Date: 03/07/2020 Continued fracture consolidation of the medial malleolus.  Persistent fracture lucency of the lateral malleolus with a sclerotic border more laterally    PMFS History: Patient Active Problem List   Diagnosis Date Noted  . Closed nondisplaced bimalleolar fracture of left lower leg 11/23/2019   History reviewed. No pertinent past medical history.  History reviewed. No pertinent family history.  History reviewed. No pertinent surgical history. Social History   Occupational History  . Not on file  Tobacco Use  . Smoking status: Former Games developer  . Smokeless tobacco: Never Used  Vaping Use  . Vaping Use: Never used  Substance and Sexual Activity  .  Alcohol use: Yes  . Drug use: Not Currently  . Sexual activity: Not on file

## 2020-05-10 ENCOUNTER — Ambulatory Visit: Payer: Medicaid Other | Admitting: Orthopaedic Surgery

## 2021-05-15 ENCOUNTER — Emergency Department (HOSPITAL_COMMUNITY)
Admission: EM | Admit: 2021-05-15 | Discharge: 2021-05-15 | Disposition: A | Discharge status: Home / Self Care | Payer: Medicaid Other | Attending: Emergency Medicine | Admitting: Emergency Medicine

## 2021-05-15 ENCOUNTER — Emergency Department (HOSPITAL_COMMUNITY): Payer: Medicaid Other

## 2021-05-15 ENCOUNTER — Other Ambulatory Visit: Payer: Self-pay

## 2021-05-15 DIAGNOSIS — Z87891 Personal history of nicotine dependence: Secondary | ICD-10-CM | POA: Insufficient documentation

## 2021-05-15 DIAGNOSIS — Y990 Civilian activity done for income or pay: Secondary | ICD-10-CM | POA: Insufficient documentation

## 2021-05-15 DIAGNOSIS — W208XXA Other cause of strike by thrown, projected or falling object, initial encounter: Secondary | ICD-10-CM | POA: Insufficient documentation

## 2021-05-15 DIAGNOSIS — M79672 Pain in left foot: Secondary | ICD-10-CM | POA: Insufficient documentation

## 2021-05-15 DIAGNOSIS — S93602A Unspecified sprain of left foot, initial encounter: Secondary | ICD-10-CM | POA: Diagnosis not present

## 2021-05-15 DIAGNOSIS — S99922A Unspecified injury of left foot, initial encounter: Secondary | ICD-10-CM | POA: Diagnosis present

## 2021-05-15 MED ORDER — IBUPROFEN 800 MG PO TABS: 800.0000 mg | ORAL_TABLET | Freq: Three times a day (TID) | ORAL | 1 refills | Status: AC | PRN

## 2021-05-15 NOTE — Discharge Instructions (Signed)
Keep foot elevated as much as possible.  Use crutches to ambulate with.  Follow-up with Dr. Linna Caprice next week for recheck

## 2021-05-15 NOTE — ED Provider Notes (Signed)
[facility] EMERGENCY DEPARTMENT Provider Note   CSN: [id number] Arrival date & time: 05/15/21  2119     History Chief Complaint  Patient presents with   Foot Pain    Jose Ochoa is a 25 y.o. male.  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  Patient complains of pain and swelling to left foot  The history is provided by the patient and medical records. No language interpreter was used.  Foot Pain This is a new problem. The current episode started 6 to 12 hours ago. The problem occurs constantly. The problem has not changed since onset.Pertinent negatives include no chest pain, no abdominal pain and no headaches. Nothing aggravates the symptoms. Nothing relieves the symptoms. He has tried nothing for the symptoms.      No past medical history on file.  Patient Active Problem List   Diagnosis Date Noted   Closed nondisplaced bimalleolar fracture of left lower leg 11/23/2019    No past surgical history on file.     No family history on file.  Social History   Tobacco Use   Smoking status: Former   Smokeless tobacco: Never  Scientific laboratory technician Use: Never used  Substance Use Topics   Alcohol use: Yes   Drug use: Not Currently    Home Medications Prior to Admission medications   Medication Sig Start Date End Date Taking? Authorizing Provider  ibuprofen (ADVIL) 800 MG tablet Take 1 tablet (800 mg total) by mouth every 8 (eight) hours as needed for moderate pain. 05/15/21  Yes Milton Ferguson, MD  gabapentin (NEURONTIN) 100 MG capsule Take 1 capsule (100 mg total) by mouth 3 (three) times daily. 11/30/19   Leandrew Koyanagi, MD  HYDROcodone-acetaminophen (NORCO) 5-325 MG tablet Take 1-2 tablets by mouth daily as needed. 01/04/20   Aundra Dubin, PA-C  ketorolac (TORADOL) 10 MG tablet Take 1 tablet (10 mg total) by mouth 2 (two) times daily as needed. 11/30/19   Leandrew Koyanagi, MD  melatonin 3 MG TABS tablet Take 1 tablet (3 mg  total) by mouth at bedtime. 12/14/19   Leandrew Koyanagi, MD  traZODone (DESYREL) 50 MG tablet Take 1 tablet (50 mg total) by mouth at bedtime as needed for sleep. 12/14/19   Leandrew Koyanagi, MD  zolpidem (AMBIEN) 5 MG tablet Take 1 tablet (5 mg total) by mouth at bedtime as needed for sleep. 12/14/19   Leandrew Koyanagi, MD    Allergies    Patient has no known allergies.  Review of Systems   Review of Systems  Constitutional:  Negative for appetite change and fatigue.  HENT:  Negative for congestion, ear discharge and sinus pressure.   Eyes:  Negative for discharge.  Respiratory:  Negative for cough.   Cardiovascular:  Negative for chest pain.  Gastrointestinal:  Negative for abdominal pain and diarrhea.  Genitourinary:  Negative for frequency and hematuria.  Musculoskeletal:  Negative for back pain.       Left foot swollen and tender  Skin:  Negative for rash.  Neurological:  Negative for seizures and headaches.  Psychiatric/Behavioral:  Negative for hallucinations.    Physical Exam Updated Vital Signs BP (!) 126/102 (BP Location: Left Arm)   Pulse 92   Temp 97.6 F (36.4 C) (Oral)   Resp 16   Ht 5\' 7"  (1.702 m)   Wt 73.5 kg   SpO2 99%   BMI 25.37 kg/m  Physical Exam Vitals and nursing note reviewed.  Constitutional:      Appearance: He is well-developed.  HENT:     Head: Normocephalic.     Nose: Nose normal.  Eyes:     General: No scleral icterus.    Conjunctiva/sclera: Conjunctivae normal.  Neck:     Thyroid: No thyromegaly.  Cardiovascular:     Rate and Rhythm: Normal rate and regular rhythm.     Heart sounds: No murmur heard.   No friction rub. No gallop.  Pulmonary:     Breath sounds: No stridor. No wheezing or rales.  Chest:     Chest wall: No tenderness.  Abdominal:     General: There is no distension.     Tenderness: There is no abdominal tenderness. There is no rebound.  Musculoskeletal:        General: Normal range of motion.     Cervical back: Neck  supple.     Comments: Swollen tender left lateral foot  Lymphadenopathy:     Cervical: No cervical adenopathy.  Skin:    Findings: No erythema or rash.  Neurological:     Mental Status: He is alert and oriented to person, place, and time.     Motor: No abnormal muscle tone.     Coordination: Coordination normal.  Psychiatric:        Behavior: Behavior normal.    ED Results / Procedures / Treatments   Labs (all labs ordered are listed, but only abnormal results are displayed) Labs Reviewed - No data to display  EKG None  Radiology DG Ankle Complete Left  Result Date: 05/15/2021 CLINICAL DATA:  Injury with foot pain. EXAM: LEFT FOOT - COMPLETE 3+ VIEW; LEFT ANKLE COMPLETE - 3+ VIEW COMPARISON:  11/19/2019. FINDINGS: There is no evidence of fracture or dislocation. There is no evidence of arthropathy or other focal bone abnormality. Marked soft tissue swelling is present over the lateral malleolus, anterior ankle, and dorsum of the midfoot. IMPRESSION: Extensive swelling over the lateral and anterior ankle and dorsum of the midfoot. No acute fracture or dislocation. Electronically Signed   By: Thornell Sartorius M.D.   On: 05/15/2021 22:07   DG Foot Complete Left  Result Date: 05/15/2021 CLINICAL DATA:  Injury with foot pain. EXAM: LEFT FOOT - COMPLETE 3+ VIEW; LEFT ANKLE COMPLETE - 3+ VIEW COMPARISON:  11/19/2019. FINDINGS: There is no evidence of fracture or dislocation. There is no evidence of arthropathy or other focal bone abnormality. Marked soft tissue swelling is present over the lateral malleolus, anterior ankle, and dorsum of the midfoot. IMPRESSION: Extensive swelling over the lateral and anterior ankle and dorsum of the midfoot. No acute fracture or dislocation. Electronically Signed   By: Thornell Sartorius M.D.   On: 05/15/2021 22:07    Procedures Procedures   Medications Ordered in ED Medications - No data to display  ED Course  I have reviewed the triage vital signs and the  nursing notes.  Pertinent labs & imaging results that were available during my care of the patient were reviewed by me and considered in my medical decision making (see chart for details).    MDM Rules/Calculators/A&P                           Patient with contusion and sprain of left foot.  He is given crutches Ace bandage and Motrin and will follow up with orthopedics Final Clinical Impression(s) / ED Diagnoses Final diagnoses:  Foot pain, left    Rx / DC Orders ED Discharge Orders          Ordered    ibuprofen (ADVIL) 800 MG tablet  Every 8 hours PRN        05/15/21 2225             Milton Ferguson, MD 05/15/21 2232

## 2021-05-15 NOTE — ED Provider Notes (Signed)
Emergency Medicine Provider Triage Evaluation Note  Arrie Zuercher , a 25 y.o. male  was evaluated in triage.  Pt complains of foot pain.  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  He states at some point he lost control of the jack and it was moving toward a pole.  He states that he jumped off however the jack hit his ankle.  He now has swelling and pain to the ankle.  Of note he has had a bimalleolar fracture of the same ankle last year from same mechanism.  Review of Systems  Positive: See above Negative:  Physical Exam  There were no vitals taken for this visit. Gen:   Awake, no distress   Resp:  Normal effort  MSK:   Left ankle is grossly swollen.  Limited range of motion of the ankle.  He is able to wiggle his toes.  Sensation intact.  2+ DP pulse.  The foot is warm. Other:    Medical Decision Making  Medically screening exam initiated at 9:26 PM.  Appropriate orders placed.  Frederick Marro was informed that the remainder of the evaluation will be completed by another provider, this initial triage assessment does not replace that evaluation, and the importance of remaining in the ED until their evaluation is complete.     Cristopher Peru, PA-C 05/15/21 2128    Bethann Berkshire, MD 05/15/21 [phone]

## 2021-05-15 NOTE — ED Triage Notes (Signed)
Pt c/o left foot and ankle pain after a pallet jack ran into his foot.

## 2022-01-12 IMAGING — CR DG TIBIA/FIBULA 2V*L*
2 series · 2 of 2 positions shown · non-contrast
Comparison: None.

CLINICAL DATA: Left lower leg pain and swelling. Leg got pinned
between a pallet and pole at work. Pain distally.

EXAM:
LEFT TIBIA AND FIBULA - 2 VIEW

[x tib-fib ap left]
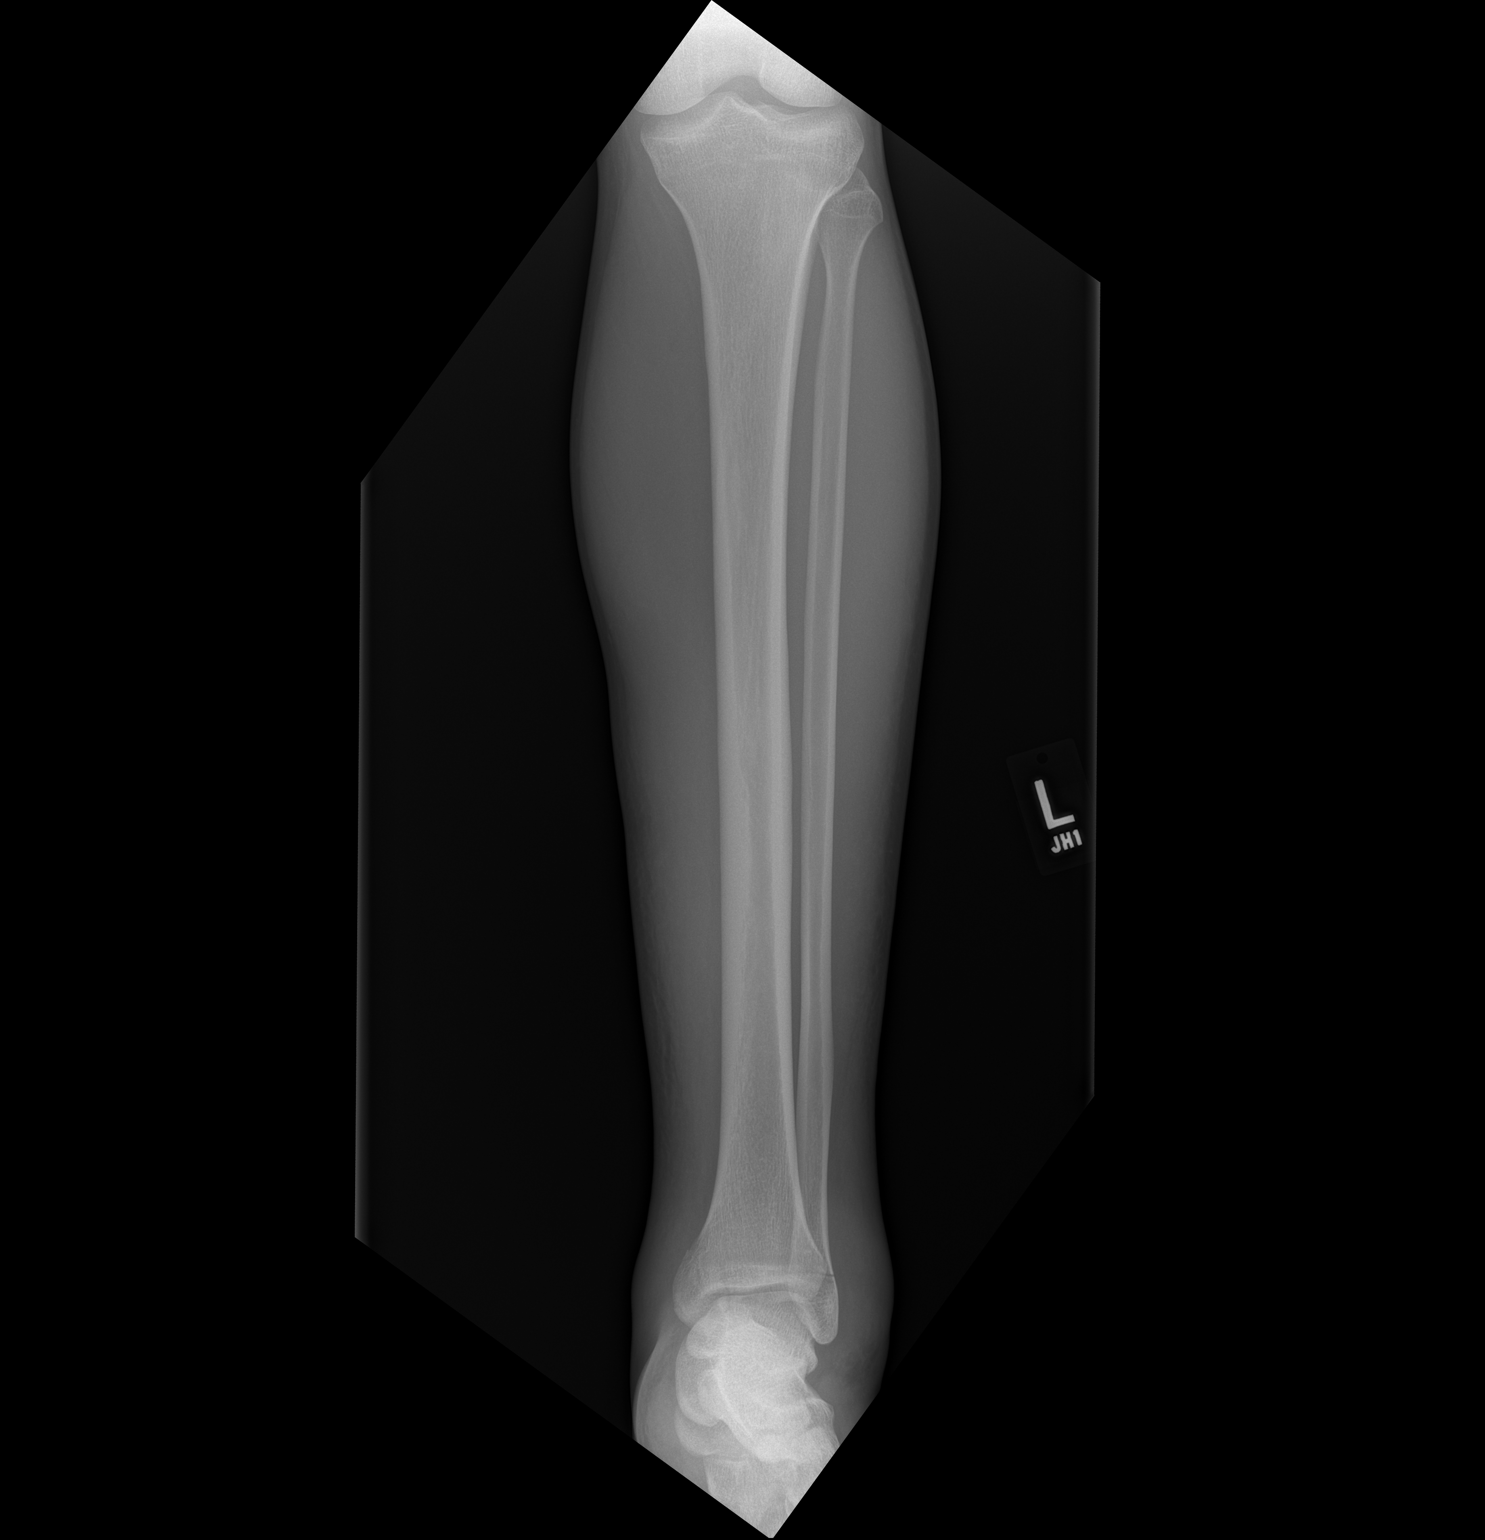

[x tib-fib lat left]
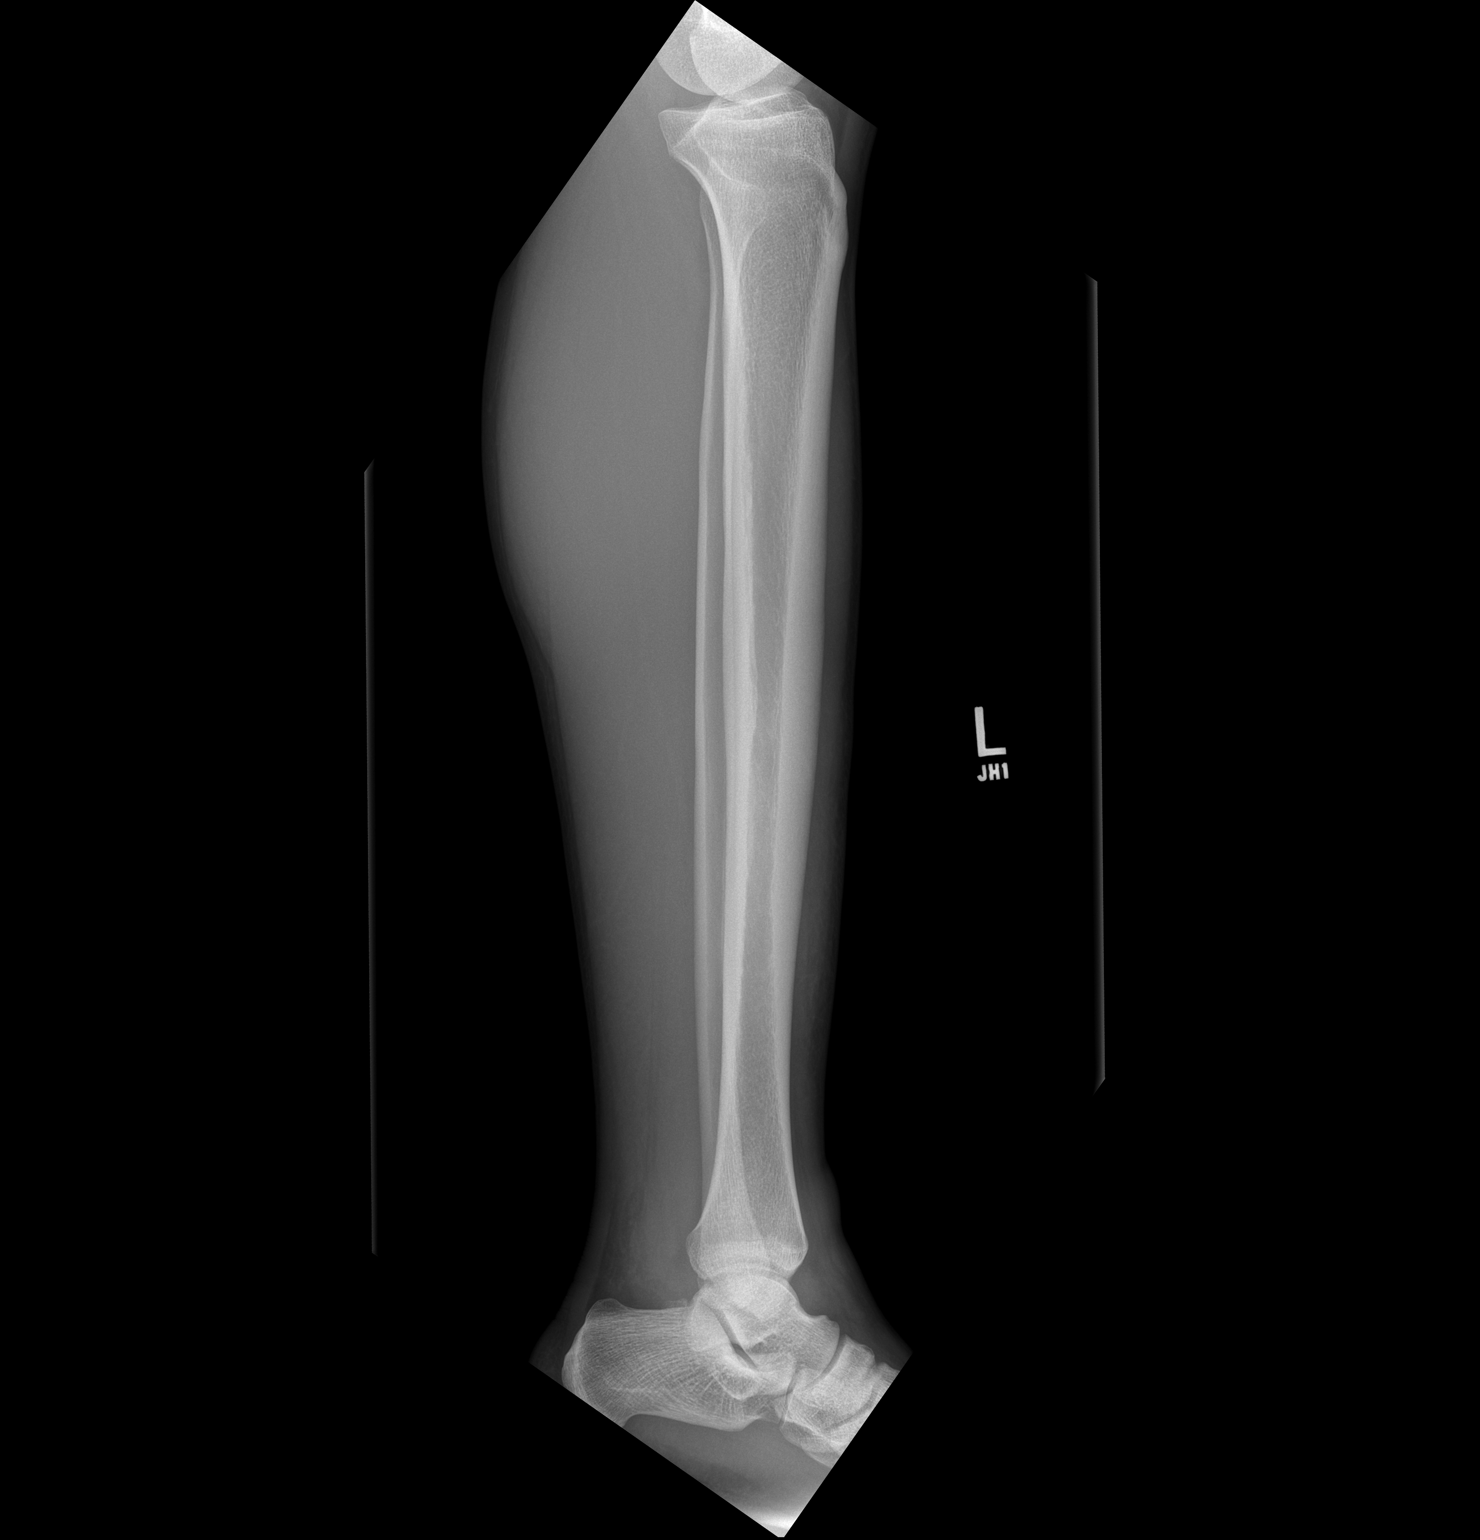

[2 of 2 positions shown; findings below may reference images not displayed]

FINDINGS: Transverse fracture of the distal fibula just above the ankle
mortise. Cortical irregularity of the distal tibia just above the
medial malleolus suspicious for acute fracture. No definite
posterior tibial tubercle fracture. Proximal tibia and fibula are
intact. Generalized soft tissue edema which is most prominent
distally. There is an ankle joint effusion.
IMPRESSION: 1. Nondisplaced fracture of the distal fibula just above the ankle
mortise.
2. Cortical irregularity of the distal tibia just above the medial
malleolus suspicious for nondisplaced fracture.
3. Soft tissue edema about the distal lower leg at the fracture
sites.

## 2022-01-13 IMAGING — DX DG FOOT COMPLETE 3+V*L*
3 series · 3 of 3 positions shown · non-contrast
Comparison: None.

CLINICAL DATA: Mechanical injury of left foot

EXAM:
LEFT FOOT - COMPLETE 3+ VIEW

[foot ap]
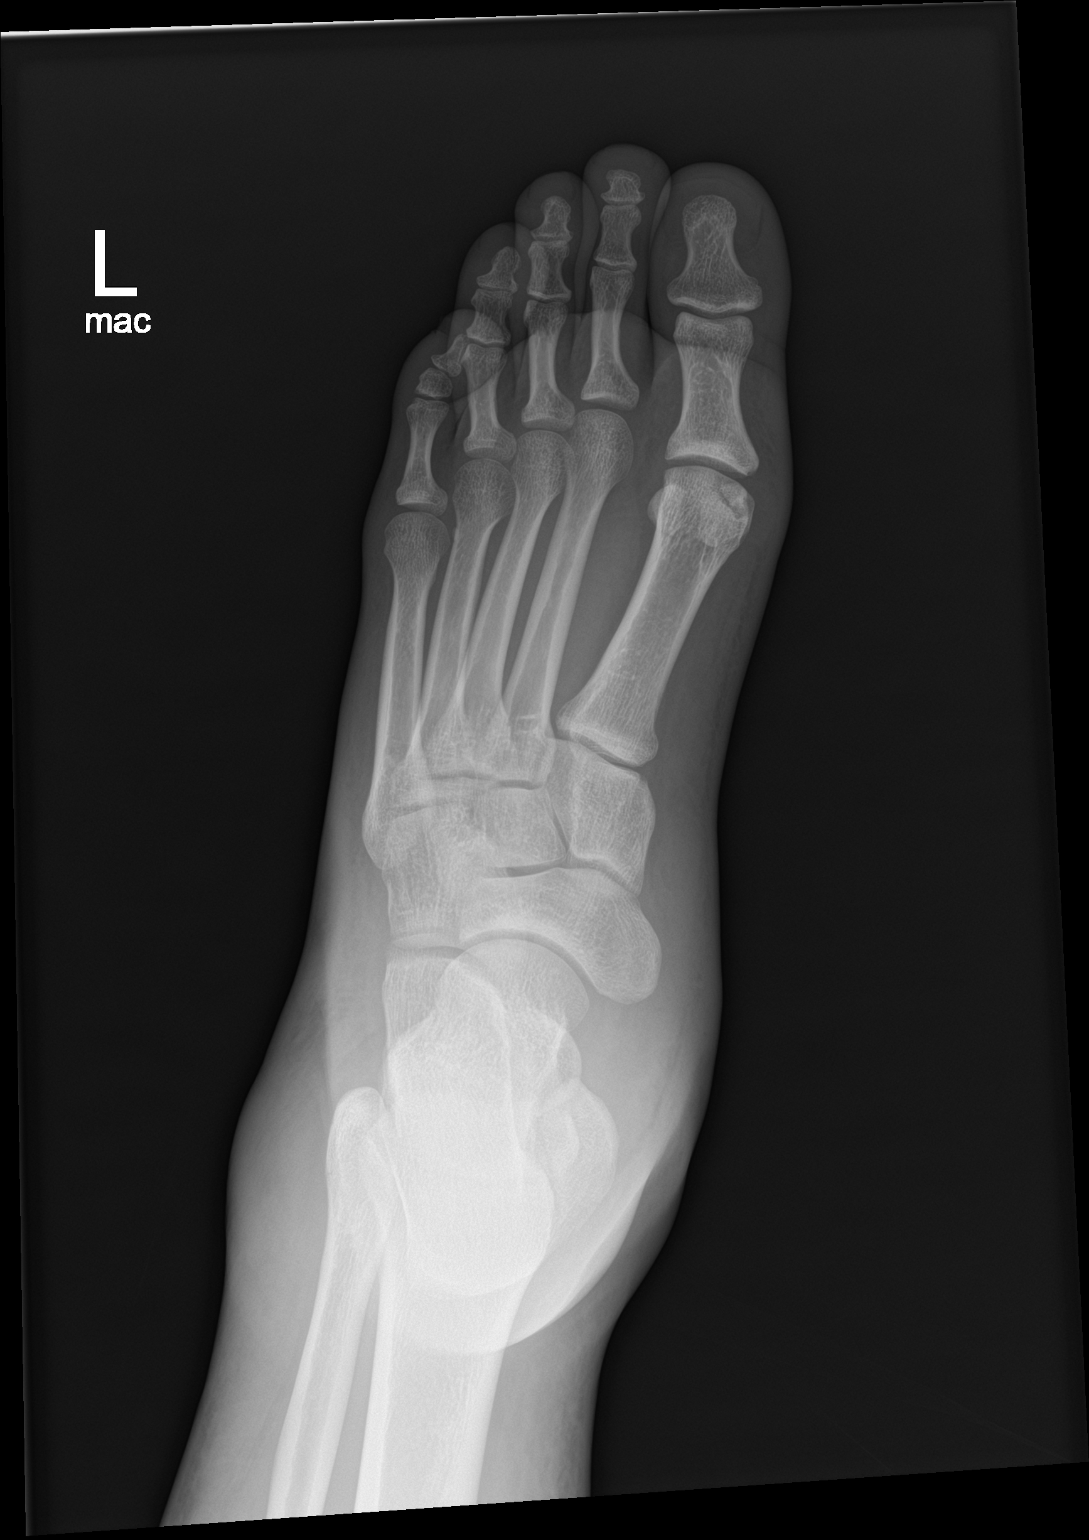

[foot obl]
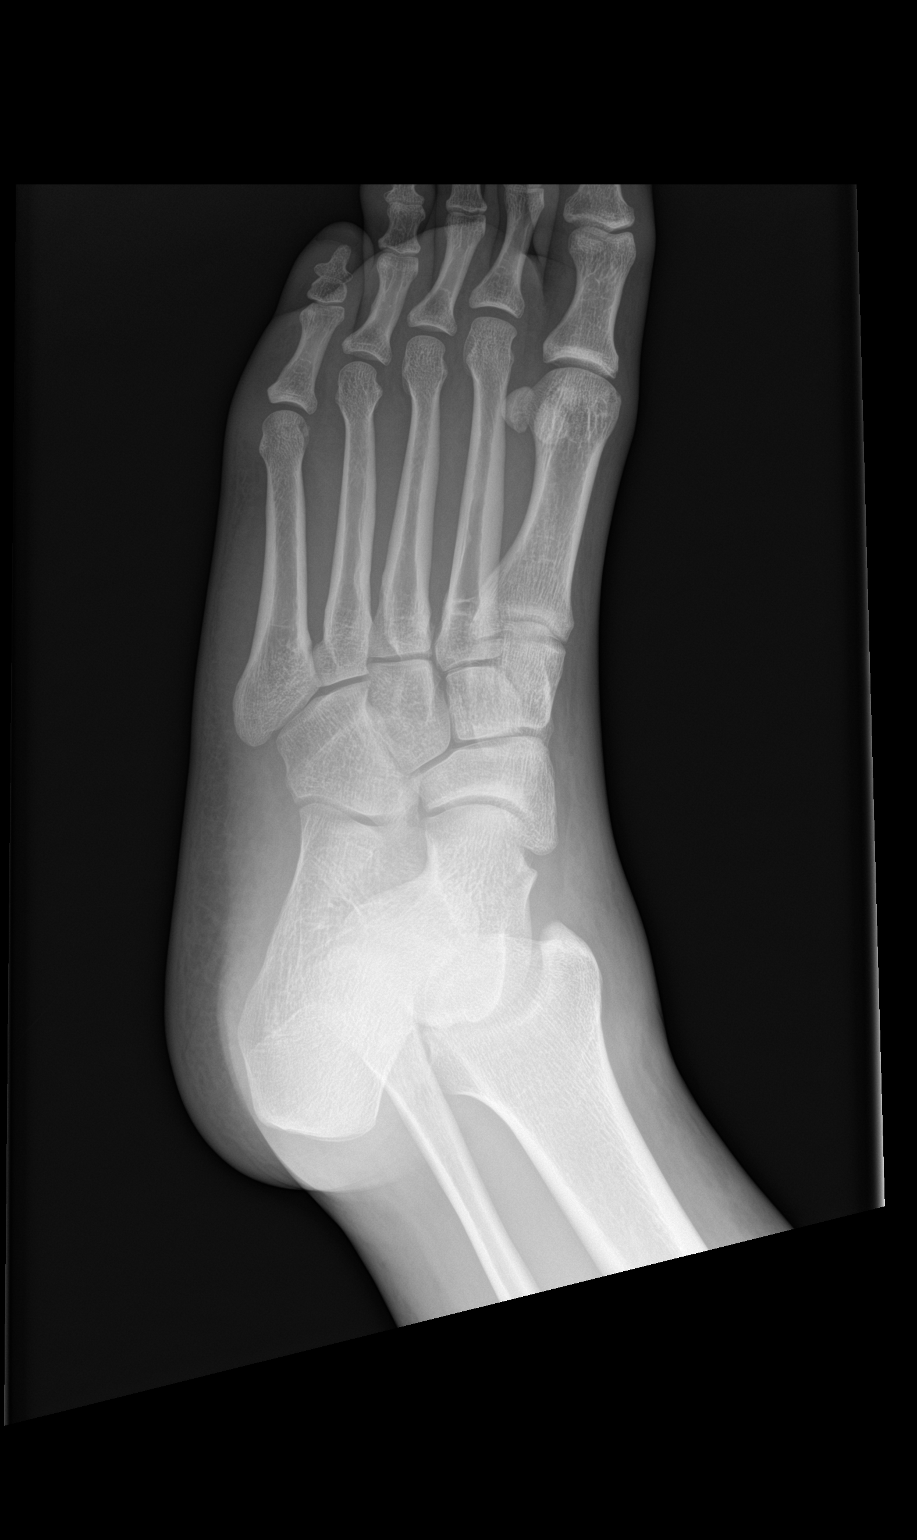

[foot lat]
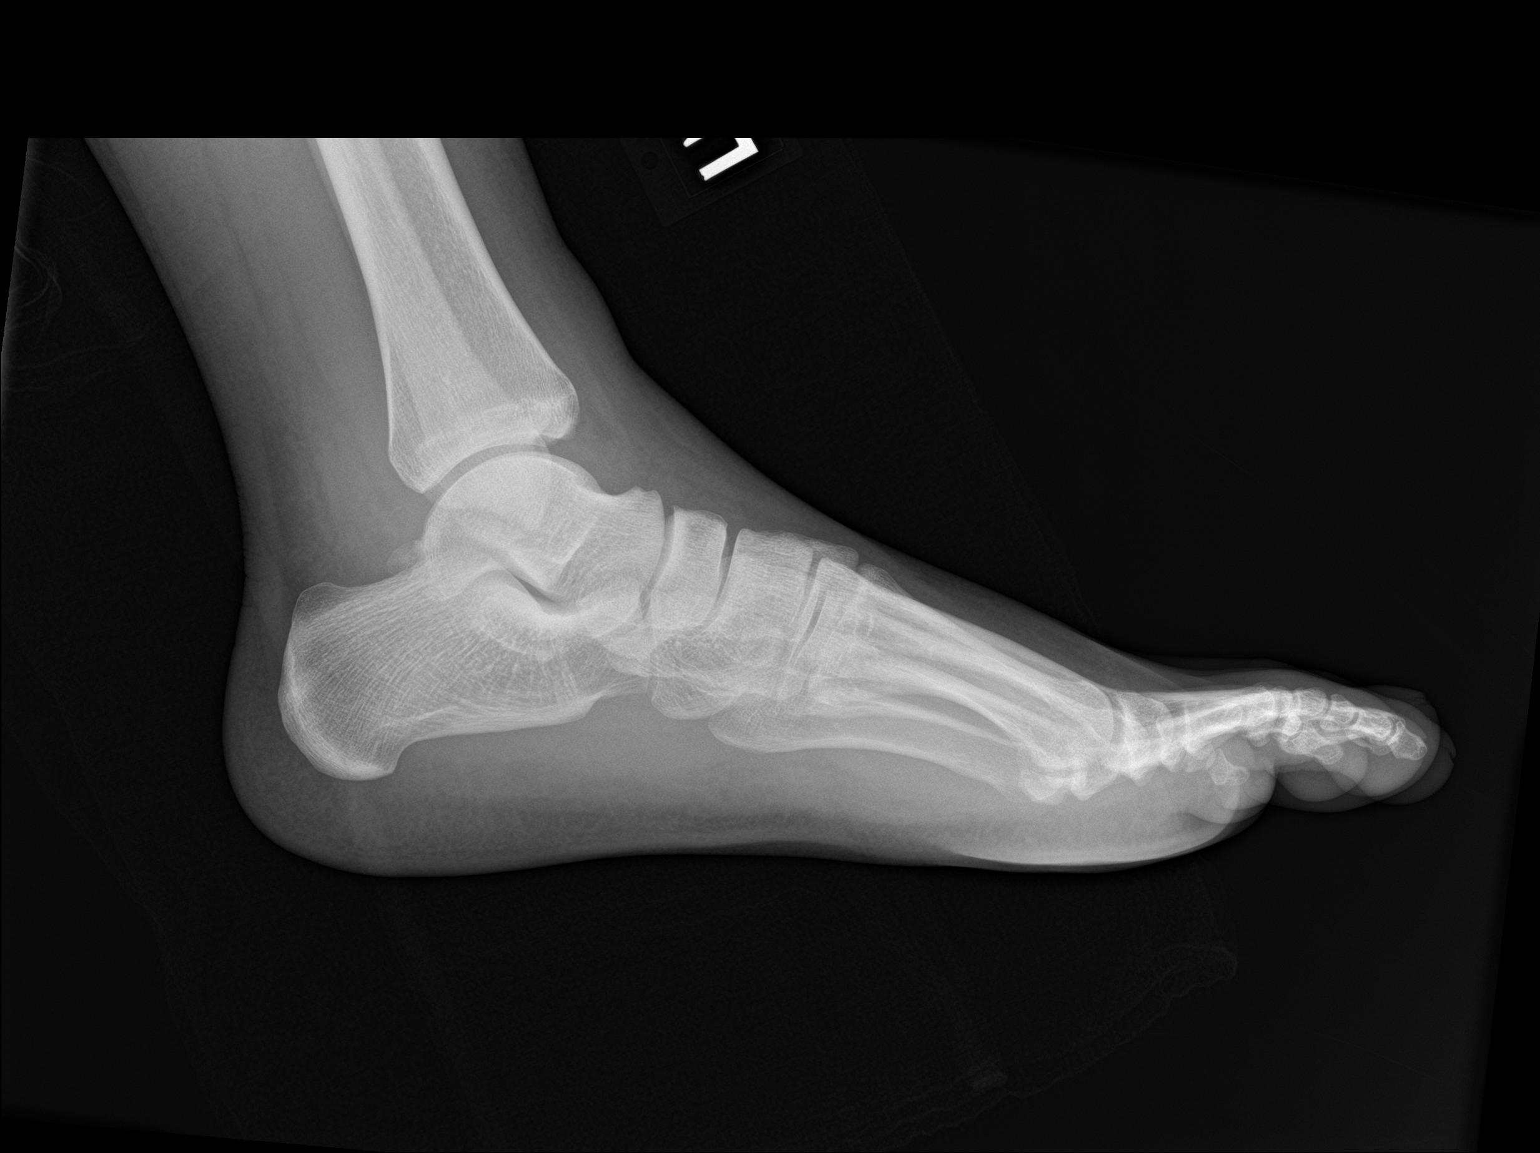

[3 of 3 positions shown; findings below may reference images not displayed]

FINDINGS: There is no evidence of fracture or dislocation. There is no
evidence of arthropathy or other focal bone abnormality. Soft
tissues are unremarkable.
IMPRESSION: Negative.
